# Patient Record
Sex: Female | Born: 1965 | Race: White | Hispanic: No | Marital: Single | Smoking: Current every day smoker
Health system: Southern US, Community
[De-identification: ages and names within clinical notes are randomized; demographics above are authoritative.]

## PROBLEM LIST (undated history)

## (undated) DIAGNOSIS — F319 Bipolar disorder, unspecified: Secondary | ICD-10-CM

## (undated) DIAGNOSIS — I10 Essential (primary) hypertension: Secondary | ICD-10-CM

## (undated) DIAGNOSIS — I671 Cerebral aneurysm, nonruptured: Secondary | ICD-10-CM

## (undated) HISTORY — PX: CHOLECYSTECTOMY: SHX55

## (undated) HISTORY — PX: OTHER SURGICAL HISTORY: SHX169

---

## 2002-02-06 ENCOUNTER — Ambulatory Visit (HOSPITAL_COMMUNITY): Admission: RE | Admit: 2002-02-06 | Discharge: 2002-02-06 | Payer: Self-pay | Admitting: *Deleted

## 2002-02-06 ENCOUNTER — Encounter: Payer: Self-pay | Admitting: *Deleted

## 2002-02-27 ENCOUNTER — Encounter: Payer: Self-pay | Admitting: Family Medicine

## 2002-02-27 ENCOUNTER — Ambulatory Visit (HOSPITAL_COMMUNITY): Admission: RE | Admit: 2002-02-27 | Discharge: 2002-02-27 | Payer: Self-pay | Admitting: Family Medicine

## 2002-08-26 ENCOUNTER — Ambulatory Visit (HOSPITAL_COMMUNITY): Admission: RE | Admit: 2002-08-26 | Discharge: 2002-08-26 | Payer: Self-pay | Admitting: *Deleted

## 2002-08-26 ENCOUNTER — Encounter: Payer: Self-pay | Admitting: *Deleted

## 2002-09-11 ENCOUNTER — Encounter (HOSPITAL_COMMUNITY): Admission: RE | Admit: 2002-09-11 | Discharge: 2002-10-11 | Payer: Self-pay | Admitting: Internal Medicine

## 2002-09-11 ENCOUNTER — Encounter (INDEPENDENT_AMBULATORY_CARE_PROVIDER_SITE_OTHER): Payer: Self-pay | Admitting: Internal Medicine

## 2002-09-24 ENCOUNTER — Encounter (INDEPENDENT_AMBULATORY_CARE_PROVIDER_SITE_OTHER): Payer: Self-pay | Admitting: Internal Medicine

## 2002-09-24 ENCOUNTER — Ambulatory Visit (HOSPITAL_COMMUNITY): Admission: RE | Admit: 2002-09-24 | Discharge: 2002-09-24 | Payer: Self-pay | Admitting: Internal Medicine

## 2003-04-20 ENCOUNTER — Emergency Department (HOSPITAL_COMMUNITY): Admission: EM | Admit: 2003-04-20 | Discharge: 2003-04-20 | Payer: Self-pay | Admitting: Emergency Medicine

## 2003-04-21 ENCOUNTER — Inpatient Hospital Stay (HOSPITAL_COMMUNITY): Admission: AD | Admit: 2003-04-21 | Discharge: 2003-04-23 | Payer: Self-pay | Admitting: General Surgery

## 2007-08-28 ENCOUNTER — Ambulatory Visit: Payer: Self-pay | Admitting: Cardiology

## 2013-08-27 ENCOUNTER — Inpatient Hospital Stay: Payer: Self-pay | Admitting: Psychiatry

## 2013-08-27 LAB — BEHAVIORAL MEDICINE 1 PANEL
Albumin: 3.6 g/dL (ref 3.4–5.0)
Anion Gap: 7 (ref 7–16)
BUN: 10 mg/dL (ref 7–18)
Bilirubin,Total: 0.2 mg/dL (ref 0.2–1.0)
Calcium, Total: 8.8 mg/dL (ref 8.5–10.1)
Chloride: 108 mmol/L — ABNORMAL HIGH (ref 98–107)
Creatinine: 1.31 mg/dL — ABNORMAL HIGH (ref 0.60–1.30)
EGFR (African American): 56 — ABNORMAL LOW
EGFR (Non-African Amer.): 48 — ABNORMAL LOW
Eosinophil #: 0.3 10*3/uL (ref 0.0–0.7)
Eosinophil %: 3.3 %
Glucose: 106 mg/dL — ABNORMAL HIGH (ref 65–99)
HCT: 37.5 % (ref 35.0–47.0)
Lymphocyte #: 2.8 10*3/uL (ref 1.0–3.6)
Lymphocyte %: 33.1 %
MCH: 31.6 pg (ref 26.0–34.0)
MCHC: 35 g/dL (ref 32.0–36.0)
Monocyte #: 0.6 x10 3/mm (ref 0.2–0.9)
Neutrophil #: 4.7 10*3/uL (ref 1.4–6.5)
Neutrophil %: 55.4 %
Osmolality: 277 (ref 275–301)
Platelet: 346 10*3/uL (ref 150–440)
Potassium: 3.7 mmol/L (ref 3.5–5.1)
RBC: 4.16 10*6/uL (ref 3.80–5.20)
RDW: 14.1 % (ref 11.5–14.5)
SGOT(AST): 34 U/L (ref 15–37)
Sodium: 139 mmol/L (ref 136–145)
Total Protein: 7.1 g/dL (ref 6.4–8.2)
WBC: 8.5 10*3/uL (ref 3.6–11.0)

## 2013-08-27 LAB — URINALYSIS, COMPLETE
Bacteria: NONE SEEN
Glucose,UR: NEGATIVE mg/dL (ref 0–75)
Ketone: NEGATIVE
Leukocyte Esterase: NEGATIVE
Nitrite: NEGATIVE
Ph: 5 (ref 4.5–8.0)
Protein: NEGATIVE
RBC,UR: <1 /HPF (ref 0–5)
Squamous Epithelial: 1

## 2013-08-29 ENCOUNTER — Ambulatory Visit: Payer: Self-pay | Admitting: Psychiatry

## 2013-09-29 ENCOUNTER — Ambulatory Visit: Payer: Self-pay | Admitting: Psychiatry

## 2013-10-28 ENCOUNTER — Ambulatory Visit: Payer: Self-pay | Admitting: Psychiatry

## 2013-11-05 ENCOUNTER — Emergency Department (HOSPITAL_COMMUNITY): Payer: Medicaid - Out of State

## 2013-11-05 ENCOUNTER — Emergency Department (HOSPITAL_COMMUNITY)
Admission: EM | Admit: 2013-11-05 | Discharge: 2013-11-05 | Disposition: A | Discharge status: Home / Self Care | Payer: Medicaid - Out of State | Attending: Emergency Medicine | Admitting: Emergency Medicine

## 2013-11-05 ENCOUNTER — Encounter (HOSPITAL_COMMUNITY): Payer: Self-pay | Admitting: Emergency Medicine

## 2013-11-05 DIAGNOSIS — F458 Other somatoform disorders: Secondary | ICD-10-CM | POA: Insufficient documentation

## 2013-11-05 DIAGNOSIS — R0602 Shortness of breath: Secondary | ICD-10-CM | POA: Insufficient documentation

## 2013-11-05 DIAGNOSIS — R290 Tetany: Secondary | ICD-10-CM | POA: Insufficient documentation

## 2013-11-05 DIAGNOSIS — F411 Generalized anxiety disorder: Secondary | ICD-10-CM | POA: Insufficient documentation

## 2013-11-05 DIAGNOSIS — R209 Unspecified disturbances of skin sensation: Secondary | ICD-10-CM | POA: Insufficient documentation

## 2013-11-05 DIAGNOSIS — Z880 Allergy status to penicillin: Secondary | ICD-10-CM | POA: Insufficient documentation

## 2013-11-05 DIAGNOSIS — I10 Essential (primary) hypertension: Secondary | ICD-10-CM | POA: Insufficient documentation

## 2013-11-05 HISTORY — DX: Bipolar disorder, unspecified: F31.9

## 2013-11-05 HISTORY — DX: Cerebral aneurysm, nonruptured: I67.1

## 2013-11-05 HISTORY — DX: Essential (primary) hypertension: I10

## 2013-11-05 LAB — CBC WITH DIFFERENTIAL/PLATELET
Basophils Absolute: 0 10*3/uL (ref 0.0–0.1)
HCT: 40.7 % (ref 36.0–46.0)
Hemoglobin: 14.2 g/dL (ref 12.0–15.0)
Lymphocytes Relative: 18 % (ref 12–46)
Lymphs Abs: 2.6 10*3/uL (ref 0.7–4.0)
MCH: 31.3 pg (ref 26.0–34.0)
Monocytes Absolute: 0.7 10*3/uL (ref 0.1–1.0)
Monocytes Relative: 5 % (ref 3–12)
Neutro Abs: 10.8 10*3/uL — ABNORMAL HIGH (ref 1.7–7.7)
Platelets: 361 10*3/uL (ref 150–400)
RBC: 4.54 MIL/uL (ref 3.87–5.11)
RDW: 13.2 % (ref 11.5–15.5)
WBC: 14.2 10*3/uL — ABNORMAL HIGH (ref 4.0–10.5)

## 2013-11-05 LAB — BASIC METABOLIC PANEL
CO2: 22 mEq/L (ref 19–32)
Chloride: 101 mEq/L (ref 96–112)
Creatinine, Ser: 1.05 mg/dL (ref 0.50–1.10)
GFR calc Af Amer: 72 mL/min — ABNORMAL LOW (ref >90)
Glucose, Bld: 99 mg/dL (ref 70–99)
Sodium: 136 mEq/L (ref 135–145)

## 2013-11-05 LAB — TROPONIN I: Troponin I: <0.3 ng/mL (ref <0.30)

## 2013-11-05 MED ORDER — ACETAMINOPHEN 500 MG PO TABS
1000.0000 mg | ORAL_TABLET | Freq: Once | ORAL | Status: AC
  Administered 2013-11-05: 1000 mg via ORAL
  Filled 2013-11-05: qty 2

## 2013-11-05 NOTE — ED Provider Notes (Addendum)
CSN: [id number]     Arrival date & time 11/05/13  1550 History   First MD Initiated Contact with Patient 11/05/13 1630     Chief Complaint  Patient presents with  . Dizziness   (Consider location/radiation/quality/duration/timing/severity/associated sxs/prior Treatment) HPI Comments: She presents to the ER for evaluation of numbness, tingling around the mouth and both hands and feet. Symptoms began suddenly around noon today. Symptoms persisted and then she had spasms and twisting of both wrists. She felt slightly short of breath when this occurred. There was no chest pain, or palpitations noted. Patient did notice pain on the anterior portion of her neck on the right side. She did not have any headache. All symptoms are improved now.   Past Medical History  Diagnosis Date  . Hypertension   . Cerebral aneurysm   . Bipolar 1 disorder    Past Surgical History  Procedure Laterality Date  . Cholecystectomy    . Prediabetic    . Ect treatments     No family history on file. History  Substance Use Topics  . Smoking status: Not on file  . Smokeless tobacco: Not on file  . Alcohol Use: Not on file   OB History   Grav Para Term Preterm Abortions TAB SAB Ect Mult Living                 Review of Systems  Respiratory: Positive for shortness of breath.   Neurological: Positive for dizziness and numbness. Negative for headaches.  All other systems reviewed and are negative.    Allergies  Ciprofloxacin; Penicillins; and Sulfa antibiotics  Home Medications  No current outpatient prescriptions on file. BP 132/91  Pulse 80  Temp(Src) 98.6 F (37 C) (Oral)  Resp 20  Ht 5\' 7"  (1.702 m)  Wt 222 lb (100.699 kg)  BMI 34.76 kg/m2  SpO2 100%  LMP 11/05/2012 Physical Exam  Constitutional: She is oriented to person, place, and time. She appears well-developed and well-nourished. No distress.  HENT:  Head: Normocephalic and atraumatic.  Right Ear: Hearing normal.  Left Ear: Hearing  normal.  Nose: Nose normal.  Mouth/Throat: Oropharynx is clear and moist and mucous membranes are normal.  Eyes: Conjunctivae and EOM are normal. Pupils are equal, round, and reactive to light.  Neck: Normal range of motion. Neck supple.  Cardiovascular: Regular rhythm, S1 normal and S2 normal.  Exam reveals no gallop and no friction rub.   No murmur heard. Pulmonary/Chest: Effort normal and breath sounds normal. No respiratory distress. She exhibits no tenderness.  Abdominal: Soft. Normal appearance and bowel sounds are normal. There is no hepatosplenomegaly. There is no tenderness. There is no rebound, no guarding, no tenderness at McBurney's point and negative Murphy's sign. No hernia.  Musculoskeletal: Normal range of motion.  Neurological: She is alert and oriented to person, place, and time. She has normal strength. No cranial nerve deficit or sensory deficit. Coordination normal. GCS eye subscore is 4. GCS verbal subscore is 5. GCS motor subscore is 6.  Skin: Skin is warm, dry and intact. No rash noted. No cyanosis.  Psychiatric: Her speech is normal and behavior is normal. Thought content normal. Her mood appears anxious.    ED Course  Procedures (including critical care time) Labs Review Labs Reviewed  CBC WITH DIFFERENTIAL - Abnormal; Notable for the following:    WBC 14.2 (*)    Neutro Abs 10.8 (*)    All other components within normal limits  BASIC METABOLIC PANEL - Abnormal;  Notable for the following:    GFR calc non Af Amer 62 (*)    GFR calc Af Amer 72 (*)    All other components within normal limits  TROPONIN I   Imaging Review Ct Head Wo Contrast  11/05/2013   CLINICAL DATA:  Dizziness, left neck pain, left arm numbness, pain down right arm, stumbling, past history of cerebral aneurysm, ECT  EXAM: CT HEAD WITHOUT CONTRAST  TECHNIQUE: Contiguous axial images were obtained from the base of the skull through the vertex without intravenous contrast.  COMPARISON:  None  available  FINDINGS: Normal ventricular morphology.  No midline shift or mass effect.  Normal appearance of brain parenchyma.  No intracranial hemorrhage, mass lesion, or definite evidence acute infarction.  Questionable low-attenuation focus versus artifact at the left pons.  No extra-axial fluid collections.  Streak artifacts at skull base including brainstem.  Bones and sinuses unremarkable.  IMPRESSION: Questionable low-attenuation focus at the left pons versus beam hardening artifact, unable to exclude a subtle left pontine infarct.  Otherwise negative exam.   Electronically Signed   By: Ulyses Southward M.D.   On: 11/05/2013 17:22    EKG Interpretation   None       Date: 11/05/2013  Rate: 61  Rhythm: normal sinus rhythm  QRS Axis: normal  Intervals: normal  ST/T Wave abnormalities: normal  Conduction Disutrbances: none  Narrative Interpretation: unremarkable     MDM  Diagnosis: Dizziness; Hyperventilation syndrome   Patient presents to the ER for evaluation symptoms are consistent with hyperventilation syndrome with carpopedal spasm. Patient had onset of numbness around the entire mouth with numbness and tingling of both hands and spasm of the wrists. There was no associated headache. Patient was concerned because she was diagnosed with a cerebral aneurysm in the past. This is apparently being watched. There are no focal findings on examination, neurologic exam is essentially normal. Nothing about history is unilateral or concerning for acute CVA. Patient did not have any chest pain associated with the symptoms. EKG was normal. Cardiac workup, lab work essentially unremarkable. CT scan did not show any evidence of acute bleeding. Questionable low-attenuation focus in the left pons, but there is not any corresponding neurologic findings or deficit to this. CT scan performed to evaluate for complication of aneurysm, no bleeding seen. Very little concern for acute CVA based on exam and  presentation.  Patient reassured, symptoms are improved without intervention. Continue current medication regimen. Patient has Valium prescribed 3 times a day as needed for anxiety, has not taken any in several days. She is encouraged to use it if she has any further anxiety symptoms. Followup with primary doctor. Return to the ER if symptoms worsen.    Gilda Crease, MD 11/05/13 1751  Gilda Crease, MD 11/05/13 [phone]

## 2013-11-05 NOTE — ED Notes (Signed)
Dizzy, pain lt side of neck and down rt arm. Says she has been stumbling . Had recent ECT treatments,,  And hx of cerebral aneurysm..   Numbness in lt arm

## 2013-11-15 LAB — CBC WITH DIFFERENTIAL/PLATELET
Basophil %: 0.5 %
Eosinophil #: 0.2 10*3/uL (ref 0.0–0.7)
Eosinophil %: 2 %
HGB: 12.5 g/dL (ref 12.0–16.0)
Lymphocyte #: 2.3 10*3/uL (ref 1.0–3.6)
Lymphocyte %: 26 %
Monocyte #: 0.5 x10 3/mm (ref 0.2–0.9)
Neutrophil #: 5.7 10*3/uL (ref 1.4–6.5)
Neutrophil %: 65.4 %
Platelet: 354 10*3/uL (ref 150–440)
RDW: 13.8 % (ref 11.5–14.5)
WBC: 8.7 10*3/uL (ref 3.6–11.0)

## 2013-11-28 ENCOUNTER — Ambulatory Visit: Payer: Self-pay | Admitting: Psychiatry

## 2013-12-30 ENCOUNTER — Ambulatory Visit: Payer: Self-pay | Admitting: Psychiatry

## 2014-01-08 ENCOUNTER — Emergency Department (HOSPITAL_COMMUNITY): Payer: Medicaid - Out of State

## 2014-01-08 ENCOUNTER — Emergency Department (HOSPITAL_COMMUNITY)
Admission: EM | Admit: 2014-01-08 | Discharge: 2014-01-08 | Discharge status: Against Medical Advice | Payer: Medicaid - Out of State | Attending: Emergency Medicine | Admitting: Emergency Medicine

## 2014-01-08 ENCOUNTER — Encounter (HOSPITAL_COMMUNITY): Payer: Self-pay | Admitting: Emergency Medicine

## 2014-01-08 DIAGNOSIS — Z880 Allergy status to penicillin: Secondary | ICD-10-CM | POA: Insufficient documentation

## 2014-01-08 DIAGNOSIS — Z7982 Long term (current) use of aspirin: Secondary | ICD-10-CM | POA: Insufficient documentation

## 2014-01-08 DIAGNOSIS — Z79899 Other long term (current) drug therapy: Secondary | ICD-10-CM | POA: Insufficient documentation

## 2014-01-08 DIAGNOSIS — Z791 Long term (current) use of non-steroidal anti-inflammatories (NSAID): Secondary | ICD-10-CM | POA: Insufficient documentation

## 2014-01-08 DIAGNOSIS — IMO0002 Reserved for concepts with insufficient information to code with codable children: Secondary | ICD-10-CM | POA: Insufficient documentation

## 2014-01-08 DIAGNOSIS — R079 Chest pain, unspecified: Secondary | ICD-10-CM

## 2014-01-08 DIAGNOSIS — R112 Nausea with vomiting, unspecified: Secondary | ICD-10-CM | POA: Insufficient documentation

## 2014-01-08 DIAGNOSIS — F319 Bipolar disorder, unspecified: Secondary | ICD-10-CM | POA: Insufficient documentation

## 2014-01-08 DIAGNOSIS — R071 Chest pain on breathing: Secondary | ICD-10-CM | POA: Insufficient documentation

## 2014-01-08 DIAGNOSIS — F17200 Nicotine dependence, unspecified, uncomplicated: Secondary | ICD-10-CM | POA: Insufficient documentation

## 2014-01-08 DIAGNOSIS — I10 Essential (primary) hypertension: Secondary | ICD-10-CM | POA: Insufficient documentation

## 2014-01-08 LAB — CBC WITH DIFFERENTIAL/PLATELET
BASOS PCT: 0 % (ref 0–1)
Basophils Absolute: 0 10*3/uL (ref 0.0–0.1)
EOS ABS: 0.3 10*3/uL (ref 0.0–0.7)
EOS PCT: 3 % (ref 0–5)
HCT: 36.3 % (ref 36.0–46.0)
Hemoglobin: 12.5 g/dL (ref 12.0–15.0)
LYMPHS ABS: 4 10*3/uL (ref 0.7–4.0)
Lymphocytes Relative: 36 % (ref 12–46)
MCH: 31.2 pg (ref 26.0–34.0)
MCHC: 34.4 g/dL (ref 30.0–36.0)
MCV: 90.5 fL (ref 78.0–100.0)
Monocytes Absolute: 0.7 10*3/uL (ref 0.1–1.0)
Monocytes Relative: 6 % (ref 3–12)
Neutro Abs: 6.1 10*3/uL (ref 1.7–7.7)
Neutrophils Relative %: 55 % (ref 43–77)
PLATELETS: 450 10*3/uL — AB (ref 150–400)
RBC: 4.01 MIL/uL (ref 3.87–5.11)
RDW: 13.6 % (ref 11.5–15.5)
WBC: 11.1 10*3/uL — ABNORMAL HIGH (ref 4.0–10.5)

## 2014-01-08 LAB — COMPREHENSIVE METABOLIC PANEL
ALBUMIN: 4.1 g/dL (ref 3.5–5.2)
ALK PHOS: 85 U/L (ref 39–117)
ALT: 25 U/L (ref 0–35)
AST: 11 U/L (ref 0–37)
BUN: 12 mg/dL (ref 6–23)
CALCIUM: 9.2 mg/dL (ref 8.4–10.5)
CO2: 23 mEq/L (ref 19–32)
Chloride: 102 mEq/L (ref 96–112)
Creatinine, Ser: 1.01 mg/dL (ref 0.50–1.10)
GFR calc non Af Amer: 65 mL/min — ABNORMAL LOW (ref >90)
GFR, EST AFRICAN AMERICAN: 76 mL/min — AB (ref >90)
GLUCOSE: 83 mg/dL (ref 70–99)
POTASSIUM: 3.9 meq/L (ref 3.7–5.3)
SODIUM: 141 meq/L (ref 137–147)
TOTAL PROTEIN: 8 g/dL (ref 6.0–8.3)
Total Bilirubin: 0.2 mg/dL — ABNORMAL LOW (ref 0.3–1.2)

## 2014-01-08 LAB — LIPASE, BLOOD: Lipase: 38 U/L (ref 11–59)

## 2014-01-08 LAB — TROPONIN I: Troponin I: <0.3 ng/mL (ref <0.30)

## 2014-01-08 MED ORDER — NITROGLYCERIN 0.4 MG SL SUBL
0.4000 mg | SUBLINGUAL_TABLET | Freq: Once | SUBLINGUAL | Status: AC
  Administered 2014-01-08: 0.4 mg via SUBLINGUAL

## 2014-01-08 MED ORDER — ONDANSETRON HCL 4 MG/2ML IJ SOLN
4.0000 mg | Freq: Once | INTRAMUSCULAR | Status: AC
  Administered 2014-01-08: 4 mg via INTRAVENOUS
  Filled 2014-01-08: qty 2

## 2014-01-08 NOTE — ED Notes (Signed)
Complain of pain in chest pain and right arm pain

## 2014-01-08 NOTE — ED Provider Notes (Signed)
CSN: [id number]     Arrival date & time 01/08/14  1720 History  This chart was scribed for [employer]. Rubin Payor, MD by Danella Maiers, ED Scribe. This patient was seen in room [facility]14[facility]14 and the patient's care was started at 5:43 PM.    Chief Complaint  Patient presents with  . Chest Pain   The history is provided by the patient. No language interpreter was used.   HPI Comments: Kristi Richardson is a 48 y.o. female with a h/o HTN who presents to the Emergency Department complaining of sudden-onset, sharp, left-sided CP with associated nausea and vomiting onset this morning when she stood up from the couch. She reports intermittent episodes of shooting pains lasting 30-45 minutes at a time since this morning. She is having mild pain currently but states it is improving. She took a baby aspirin this morning. She started on blood pressure medication yesterday. She has never had a stress test. She reports feeling fine the last few days. She has been undergoing ECT once per week, last treatment was 5 days ago. She denies any new activites or exercise. Her mom has CHF and both parents have had MIs.    Past Medical History  Diagnosis Date  . Hypertension   . Cerebral aneurysm   . Bipolar 1 disorder    Past Surgical History  Procedure Laterality Date  . Cholecystectomy    . Prediabetic    . Ect treatments     No family history on file. History  Substance Use Topics  . Smoking status: Current Some Day Smoker  . Smokeless tobacco: Not on file  . Alcohol Use: No   OB History   Grav Para Term Preterm Abortions TAB SAB Ect Mult Living                 Review of Systems  Cardiovascular: Positive for chest pain.  Gastrointestinal: Positive for nausea and vomiting.   A complete 10 system review of systems was obtained and all systems are negative except as noted in the HPI and PMH.     Allergies  Penicillins; Ciprofloxacin; Risperidone and related; and Sulfa antibiotics  Home Medications    Current Outpatient Rx  Name  Route  Sig  Dispense  Refill  . albuterol (PROVENTIL HFA) 108 (90 BASE) MCG/ACT inhaler   Inhalation   Inhale 1 puff into the lungs every 6 (six) hours as needed for wheezing or shortness of breath.         Marland Kitchen amLODipine (NORVASC) 5 MG tablet   Oral   Take 5 mg by mouth daily.         Marland Kitchen aspirin EC 81 MG tablet   Oral   Take 81 mg by mouth every morning.         Marland Kitchen atenolol (TENORMIN) 25 MG tablet   Oral   Take 25 mg by mouth every morning.         . celecoxib (CELEBREX) 200 MG capsule   Oral   Take 200 mg by mouth 2 (two) times daily.         . cloNIDine (CATAPRES) 0.1 MG tablet   Oral   Take 0.1 mg by mouth at bedtime.         Marland Kitchen dexlansoprazole (DEXILANT) 60 MG capsule   Oral   Take 60 mg by mouth every morning.         . diazepam (VALIUM) 10 MG tablet   Oral   Take 5 mg  by mouth 3 (three) times daily as needed for anxiety or sleep.         . DULoxetine (CYMBALTA) 30 MG capsule   Oral   Take 30 mg by mouth daily.         . fluticasone-salmeterol (ADVAIR HFA) 230-21 MCG/ACT inhaler   Inhalation   Inhale 2 puffs into the lungs daily.         Marland Kitchen gabapentin (NEURONTIN) 300 MG capsule   Oral   Take 300 mg by mouth 4 (four) times daily.         . hydrOXYzine (VISTARIL) 25 MG capsule   Oral   Take 25 mg by mouth 2 (two) times daily as needed for anxiety (and/or sleep).         Marland Kitchen levothyroxine (SYNTHROID, LEVOTHROID) 75 MCG tablet   Oral   Take 75 mcg by mouth every morning.         . metFORMIN (GLUCOPHAGE-XR) 500 MG 24 hr tablet   Oral   Take 500 mg by mouth every morning.         Marland Kitchen omega-3 acid ethyl esters (LOVAZA) 1 G capsule   Oral   Take 1 g by mouth 2 (two) times daily.         . ondansetron (ZOFRAN) 4 MG tablet   Oral   Take 4 mg by mouth 2 (two) times daily as needed for nausea or vomiting.         . ranitidine (ZANTAC) 150 MG tablet   Oral   Take 150 mg by mouth at bedtime.          . rosuvastatin (CRESTOR) 20 MG tablet   Oral   Take 20 mg by mouth at bedtime.          BP 141/97  Pulse 99  Temp(Src) 98 F (36.7 C)  Resp 20  Ht 5\' 6"  (1.676 m)  Wt 240 lb (108.863 kg)  BMI 38.76 kg/m2  SpO2 96%  LMP 11/05/2012 Physical Exam  Nursing note and vitals reviewed. Constitutional: She is oriented to person, place, and time. She appears well-developed and well-nourished. No distress.  Flat affect  HENT:  Head: Normocephalic and atraumatic.  Eyes: EOM are normal.  Neck: Neck supple. No tracheal deviation present.  Cardiovascular: Normal rate, regular rhythm and normal heart sounds.   Pulmonary/Chest: Effort normal and breath sounds normal. No respiratory distress.  Mild tenderness of the anterior chest  Abdominal: Soft. There is no tenderness.  Musculoskeletal: Normal range of motion. She exhibits no edema.  Neurological: She is alert and oriented to person, place, and time.  Skin: Skin is warm and dry.  Psychiatric: She has a normal mood and affect. Her behavior is normal.    ED Course  Procedures (including critical care time) Medications  ondansetron (ZOFRAN) injection 4 mg (4 mg Intravenous Given 01/08/14 1822)  nitroGLYCERIN (NITROSTAT) SL tablet 0.4 mg (0.4 mg Sublingual Given 01/08/14 1822)    COORDINATION OF CARE: 5:57 PM- Discussed treatment plan with pt. Pt agrees to plan.  Results for orders placed during the hospital encounter of 01/08/14  CBC WITH DIFFERENTIAL      Result Value Ref Range   WBC 11.1 (*) 4.0 - 10.5 K/uL   RBC 4.01  3.87 - 5.11 MIL/uL   Hemoglobin 12.5  12.0 - 15.0 g/dL   HCT 40.9  81.1 - 91.4 %   MCV 90.5  78.0 - 100.0 fL   MCH 31.2  26.0 - 34.0  pg   MCHC 34.4  30.0 - 36.0 g/dL   RDW 16.1  09.6 - 04.5 %   Platelets 450 (*) 150 - 400 K/uL   Neutrophils Relative % 55  43 - 77 %   Neutro Abs 6.1  1.7 - 7.7 K/uL   Lymphocytes Relative 36  12 - 46 %   Lymphs Abs 4.0  0.7 - 4.0 K/uL   Monocytes Relative 6  3 - 12 %    Monocytes Absolute 0.7  0.1 - 1.0 K/uL   Eosinophils Relative 3  0 - 5 %   Eosinophils Absolute 0.3  0.0 - 0.7 K/uL   Basophils Relative 0  0 - 1 %   Basophils Absolute 0.0  0.0 - 0.1 K/uL  COMPREHENSIVE METABOLIC PANEL      Result Value Ref Range   Sodium 141  137 - 147 mEq/L   Potassium 3.9  3.7 - 5.3 mEq/L   Chloride 102  96 - 112 mEq/L   CO2 23  19 - 32 mEq/L   Glucose, Bld 83  70 - 99 mg/dL   BUN 12  6 - 23 mg/dL   Creatinine, Ser 4.09  0.50 - 1.10 mg/dL   Calcium 9.2  8.4 - 81.1 mg/dL   Total Protein 8.0  6.0 - 8.3 g/dL   Albumin 4.1  3.5 - 5.2 g/dL   AST 11  0 - 37 U/L   ALT 25  0 - 35 U/L   Alkaline Phosphatase 85  39 - 117 U/L   Total Bilirubin 0.2 (*) 0.3 - 1.2 mg/dL   GFR calc non Af Amer 65 (*) >90 mL/min   GFR calc Af Amer 76 (*) >90 mL/min  LIPASE, BLOOD      Result Value Ref Range   Lipase 38  11 - 59 U/L  TROPONIN I      Result Value Ref Range   Troponin I <0.30  <0.30 ng/mL   Dg Chest 2 View  01/08/2014   CLINICAL DATA:  Shortness of breath and chest pain  EXAM: CHEST  2 VIEW  COMPARISON:  None.  FINDINGS: The heart size and mediastinal contours are within normal limits. There is no focal infiltrate, pulmonary edema, or pleural effusion. There is mild scar or atelectasis of the bilateral lung bases. The visualized skeletal structures are unremarkable.  IMPRESSION: No active cardiopulmonary disease. Mild scar or atelectasis of the bilateral lung bases.   Electronically Signed   By: Sherian Rein M.D.   On: 01/08/2014 19:13      EKG Interpretation    Date/Time:  Wednesday January 08 2014 17:42:27 EST Ventricular Rate:  98 PR Interval:  156 QRS Duration: 88 QT Interval:  374 QTC Calculation: 477 R Axis:   19 Text Interpretation:  Normal sinus rhythm Normal ECG When compared with ECG of 05-Nov-2013 17:24, Vent. rate has increased BY  37 BPM QT has lengthened Confirmed by Rubin Payor  MD, Janaysha Depaulo ([id number]) on 01/08/2014 6:14:08 PM            MDM   Final  diagnoses:  Chest pain    Patient with chest pain. EKG reassuring and enzymes are negative x1. Patient has had some pain with exertion. She is pain-free now. She's not willing to stay for further evaluation. She is leaving against my advice. She's informed of the risks of this. She states she's got primary care followup tomorrow.  I personally performed the services described in this documentation, which was scribed  in my presence. The recorded information has been reviewed and is accurate.    Juliet RudeNathan R. Rubin PayorPickering, MD 01/08/14 2023

## 2014-01-08 NOTE — Discharge Instructions (Signed)
Chest Pain (Nonspecific) °It is often hard to give a specific diagnosis for the cause of chest pain. There is always a chance that your pain could be related to something serious, such as a heart attack or a blood clot in the lungs. You need to follow up with your caregiver for further evaluation. °CAUSES  °· Heartburn. °· Pneumonia or bronchitis. °· Anxiety or stress. °· Inflammation around your heart (pericarditis) or lung (pleuritis or pleurisy). °· A blood clot in the lung. °· A collapsed lung (pneumothorax). It can develop suddenly on its own (spontaneous pneumothorax) or from injury (trauma) to the chest. °· Shingles infection (herpes zoster virus). °The chest wall is composed of bones, muscles, and cartilage. Any of these can be the source of the pain. °· The bones can be bruised by injury. °· The muscles or cartilage can be strained by coughing or overwork. °· The cartilage can be affected by inflammation and become sore (costochondritis). °DIAGNOSIS  °Lab tests or other studies, such as X-rays, electrocardiography, stress testing, or cardiac imaging, may be needed to find the cause of your pain.  °TREATMENT  °· Treatment depends on what may be causing your chest pain. Treatment may include: °· Acid blockers for heartburn. °· Anti-inflammatory medicine. °· Pain medicine for inflammatory conditions. °· Antibiotics if an infection is present. °· You may be advised to change lifestyle habits. This includes stopping smoking and avoiding alcohol, caffeine, and chocolate. °· You may be advised to keep your head raised (elevated) when sleeping. This reduces the chance of acid going backward from your stomach into your esophagus. °· Most of the time, nonspecific chest pain will improve within 2 to 3 days with rest and mild pain medicine. °HOME CARE INSTRUCTIONS  °· If antibiotics were prescribed, take your antibiotics as directed. Finish them even if you start to feel better. °· For the next few days, avoid physical  activities that bring on chest pain. Continue physical activities as directed. °· Do not smoke. °· Avoid drinking alcohol. °· Only take over-the-counter or prescription medicine for pain, discomfort, or fever as directed by your caregiver. °· Follow your caregiver's suggestions for further testing if your chest pain does not go away. °· Keep any follow-up appointments you made. If you do not go to an appointment, you could develop lasting (chronic) problems with pain. If there is any problem keeping an appointment, you must call to reschedule. °SEEK MEDICAL CARE IF:  °· You think you are having problems from the medicine you are taking. Read your medicine instructions carefully. °· Your chest pain does not go away, even after treatment. °· You develop a rash with blisters on your chest. °SEEK IMMEDIATE MEDICAL CARE IF:  °· You have increased chest pain or pain that spreads to your arm, neck, jaw, back, or abdomen. °· You develop shortness of breath, an increasing cough, or you are coughing up blood. °· You have severe back or abdominal pain, feel nauseous, or vomit. °· You develop severe weakness, fainting, or chills. °· You have a fever. °THIS IS AN EMERGENCY. Do not wait to see if the pain will go away. Get medical help at once. Call your local emergency services (911 in [place] Do not drive yourself to the hospital. °MAKE SURE YOU:  °· Understand these instructions. °· Will watch your condition. °· Will get help right away if you are not doing well or get worse. °Document Released: 08/24/2005 Document Revised: 02/06/2012 Document Reviewed: 06/19/2008 °[employer]® Patient Information ©2014 [employer]. ° °

## 2014-01-26 ENCOUNTER — Ambulatory Visit: Payer: Self-pay | Admitting: Psychiatry

## 2014-02-07 ENCOUNTER — Emergency Department (HOSPITAL_COMMUNITY): Payer: Medicaid - Out of State

## 2014-02-07 ENCOUNTER — Encounter (HOSPITAL_COMMUNITY): Payer: Self-pay | Admitting: Emergency Medicine

## 2014-02-07 ENCOUNTER — Emergency Department (HOSPITAL_COMMUNITY)
Admission: EM | Admit: 2014-02-07 | Discharge: 2014-02-07 | Disposition: A | Discharge status: Home / Self Care | Payer: Medicaid - Out of State | Attending: Emergency Medicine | Admitting: Emergency Medicine

## 2014-02-07 DIAGNOSIS — R05 Cough: Secondary | ICD-10-CM

## 2014-02-07 DIAGNOSIS — J3489 Other specified disorders of nose and nasal sinuses: Secondary | ICD-10-CM | POA: Insufficient documentation

## 2014-02-07 DIAGNOSIS — F319 Bipolar disorder, unspecified: Secondary | ICD-10-CM | POA: Insufficient documentation

## 2014-02-07 DIAGNOSIS — R059 Cough, unspecified: Secondary | ICD-10-CM | POA: Insufficient documentation

## 2014-02-07 DIAGNOSIS — J029 Acute pharyngitis, unspecified: Secondary | ICD-10-CM | POA: Insufficient documentation

## 2014-02-07 DIAGNOSIS — R062 Wheezing: Secondary | ICD-10-CM | POA: Insufficient documentation

## 2014-02-07 DIAGNOSIS — F17200 Nicotine dependence, unspecified, uncomplicated: Secondary | ICD-10-CM | POA: Insufficient documentation

## 2014-02-07 DIAGNOSIS — I10 Essential (primary) hypertension: Secondary | ICD-10-CM | POA: Insufficient documentation

## 2014-02-07 DIAGNOSIS — Z791 Long term (current) use of non-steroidal anti-inflammatories (NSAID): Secondary | ICD-10-CM | POA: Insufficient documentation

## 2014-02-07 DIAGNOSIS — Z880 Allergy status to penicillin: Secondary | ICD-10-CM | POA: Insufficient documentation

## 2014-02-07 DIAGNOSIS — Z79899 Other long term (current) drug therapy: Secondary | ICD-10-CM | POA: Insufficient documentation

## 2014-02-07 DIAGNOSIS — Z7982 Long term (current) use of aspirin: Secondary | ICD-10-CM | POA: Insufficient documentation

## 2014-02-07 DIAGNOSIS — IMO0002 Reserved for concepts with insufficient information to code with codable children: Secondary | ICD-10-CM | POA: Insufficient documentation

## 2014-02-07 MED ORDER — BENZONATATE 100 MG PO CAPS
200.0000 mg | ORAL_CAPSULE | Freq: Once | ORAL | Status: AC
  Administered 2014-02-07: 200 mg via ORAL
  Filled 2014-02-07: qty 2

## 2014-02-07 MED ORDER — IPRATROPIUM-ALBUTEROL 0.5-2.5 (3) MG/3ML IN SOLN
3.0000 mL | Freq: Once | RESPIRATORY_TRACT | Status: AC
  Administered 2014-02-07: 3 mL via RESPIRATORY_TRACT
  Filled 2014-02-07: qty 3

## 2014-02-07 MED ORDER — BENZONATATE 100 MG PO CAPS: 200.0000 mg | ORAL_CAPSULE | Freq: Three times a day (TID) | ORAL | Status: AC | PRN

## 2014-02-07 MED ORDER — ALBUTEROL SULFATE (2.5 MG/3ML) 0.083% IN NEBU
2.5000 mg | INHALATION_SOLUTION | Freq: Once | RESPIRATORY_TRACT | Status: AC
Start: 2014-02-07 — End: 2014-02-07
  Administered 2014-02-07: 2.5 mg via RESPIRATORY_TRACT
  Filled 2014-02-07: qty 3

## 2014-02-07 MED ORDER — AZITHROMYCIN 250 MG PO TABS: ORAL_TABLET | ORAL | Status: AC

## 2014-02-07 NOTE — Discharge Instructions (Signed)

## 2014-02-07 NOTE — ED Notes (Signed)
Cough for 4 weeks, green phlegm.   Pt says she has lost 22 lbs.  Due to cough. Skin itches.

## 2014-02-07 NOTE — ED Provider Notes (Signed)
CSN: [id number]     Arrival date & time 02/07/14  1127 History   First MD Initiated Contact with Patient 02/07/14 1300     Chief Complaint  Patient presents with  . Cough     (Consider location/radiation/quality/duration/timing/severity/associated sxs/prior Treatment) Patient is a 48 y.o. female presenting with cough. The history is provided by the patient.  Cough Cough characteristics:  Productive Sputum characteristics:  Yellow and green Severity:  Moderate Duration:  4 weeks Timing:  Constant Progression:  Unchanged Chronicity:  New Smoker: yes   Context: upper respiratory infection   Relieved by:  Nothing Worsened by:  Activity and smoking Ineffective treatments:  Cough suppressants Associated symptoms: sore throat and wheezing   Associated symptoms: no chest pain, no chills, no ear pain, no fever, no headaches, no myalgias, no rash, no rhinorrhea, no shortness of breath and no sinus congestion   Sore throat:    Severity:  Mild   Onset quality:  Gradual   Timing:  Constant   Progression:  Unchanged Wheezing:    Severity:  Moderate   Onset quality:  Gradual   Duration:  4 weeks   Progression:  Unchanged   Chronicity:  New Risk factors: no recent infection and no recent travel     Past Medical History  Diagnosis Date  . Hypertension   . Cerebral aneurysm   . Bipolar 1 disorder    Past Surgical History  Procedure Laterality Date  . Cholecystectomy    . Prediabetic    . Ect treatments     History reviewed. No pertinent family history. History  Substance Use Topics  . Smoking status: Current Every Day Smoker    Types: Cigarettes  . Smokeless tobacco: Not on file  . Alcohol Use: No   OB History   Grav Para Term Preterm Abortions TAB SAB Ect Mult Living                 Review of Systems  Constitutional: Negative for fever, chills, activity change and appetite change.  HENT: Positive for congestion and sore throat. Negative for ear pain, facial swelling,  rhinorrhea and trouble swallowing.   Eyes: Negative for visual disturbance.  Respiratory: Positive for cough and wheezing. Negative for chest tightness, shortness of breath and stridor.   Cardiovascular: Negative for chest pain.  Gastrointestinal: Negative for nausea, vomiting and abdominal pain.  Musculoskeletal: Negative for myalgias, neck pain and neck stiffness.  Skin: Negative.  Negative for rash.  Neurological: Negative for dizziness, weakness, numbness and headaches.  Hematological: Negative for adenopathy.  Psychiatric/Behavioral: Negative for confusion.  All other systems reviewed and are negative.      Allergies  Penicillins; Ciprofloxacin; Norvasc; Risperidone and related; and Sulfa antibiotics  Home Medications   Current Outpatient Rx  Name  Route  Sig  Dispense  Refill  . albuterol (PROVENTIL HFA) 108 (90 BASE) MCG/ACT inhaler   Inhalation   Inhale 1 puff into the lungs every 6 (six) hours as needed for wheezing or shortness of breath.         Marland Kitchen. amLODipine (NORVASC) 5 MG tablet   Oral   Take 5 mg by mouth daily.         Marland Kitchen. aspirin EC 81 MG tablet   Oral   Take 81 mg by mouth every morning.         Marland Kitchen. atenolol (TENORMIN) 25 MG tablet   Oral   Take 50 mg by mouth daily.          .Marland Kitchen  butalbital-acetaminophen-caffeine (FIORICET, ESGIC) 50-325-40 MG per tablet   Oral   Take 1 tablet by mouth every 6 (six) hours as needed for headache or migraine.         . celecoxib (CELEBREX) 200 MG capsule   Oral   Take 200 mg by mouth 2 (two) times daily.         . cloNIDine (CATAPRES) 0.1 MG tablet   Oral   Take 0.1 mg by mouth at bedtime.         Marland Kitchen dexlansoprazole (DEXILANT) 60 MG capsule   Oral   Take 60 mg by mouth every morning.         . diazepam (VALIUM) 10 MG tablet   Oral   Take 5 mg by mouth 3 (three) times daily as needed for anxiety or sleep.         . DULoxetine (CYMBALTA) 30 MG capsule   Oral   Take 30 mg by mouth daily.         .  fluticasone-salmeterol (ADVAIR HFA) 230-21 MCG/ACT inhaler   Inhalation   Inhale 2 puffs into the lungs daily.         Marland Kitchen gabapentin (NEURONTIN) 300 MG capsule   Oral   Take 300 mg by mouth 4 (four) times daily.         . hydrOXYzine (VISTARIL) 25 MG capsule   Oral   Take 25 mg by mouth 2 (two) times daily as needed for anxiety (and/or sleep).         Marland Kitchen levothyroxine (SYNTHROID, LEVOTHROID) 75 MCG tablet   Oral   Take 75 mcg by mouth every morning.         . metFORMIN (GLUCOPHAGE-XR) 500 MG 24 hr tablet   Oral   Take 500 mg by mouth every morning.         Marland Kitchen omega-3 acid ethyl esters (LOVAZA) 1 G capsule   Oral   Take 1 g by mouth 2 (two) times daily.         . ondansetron (ZOFRAN) 4 MG tablet   Oral   Take 4 mg by mouth every 8 (eight) hours as needed for nausea or vomiting.          . ranitidine (ZANTAC) 150 MG tablet   Oral   Take 150 mg by mouth at bedtime.         . rosuvastatin (CRESTOR) 20 MG tablet   Oral   Take 20 mg by mouth at bedtime.         Marland Kitchen azithromycin (ZITHROMAX Z-PAK) 250 MG tablet      Take two tablets on day one, then one tab qd days 2-5   6 tablet   0   . benzonatate (TESSALON) 100 MG capsule   Oral   Take 2 capsules (200 mg total) by mouth 3 (three) times daily as needed for cough. Swallow whole, do not chew   21 capsule   0    BP 117/94  Pulse 83  Temp(Src) 98.5 F (36.9 C) (Oral)  Resp 20  Ht 5\' 6"  (1.676 m)  Wt 225 lb (102.059 kg)  BMI 36.33 kg/m2  SpO2 97%  LMP 01/07/2014 Physical Exam  Nursing note and vitals reviewed. Constitutional: She is oriented to person, place, and time. She appears well-developed and well-nourished. No distress.  HENT:  Head: Normocephalic and atraumatic.  Right Ear: Tympanic membrane and ear canal normal.  Left Ear: Tympanic membrane and ear canal normal.  Mouth/Throat:  Uvula is midline and mucous membranes are normal. Posterior oropharyngeal erythema present. No oropharyngeal  exudate, posterior oropharyngeal edema or tonsillar abscesses.  Eyes: EOM are normal. Pupils are equal, round, and reactive to light.  Neck: Normal range of motion, full passive range of motion without pain and phonation normal. Neck supple.  Cardiovascular: Normal rate, regular rhythm, normal heart sounds and intact distal pulses.   No murmur heard. Pulmonary/Chest: Effort normal. No stridor. No respiratory distress. She has wheezes. She has no rales. She exhibits no tenderness.  Coarse lungs sounds bilaterally.  Scattered expiratory wheezes.  No rales  Abdominal: Soft. She exhibits no distension. There is no tenderness. There is no rebound and no guarding.  Musculoskeletal: Normal range of motion. She exhibits no edema.  Lymphadenopathy:    She has no cervical adenopathy.  Neurological: She is alert and oriented to person, place, and time. She exhibits normal muscle tone. Coordination normal.  Skin: Skin is warm and dry.    ED Course  Procedures (including critical care time) Labs Review Labs Reviewed - No data to display Imaging Review Dg Chest 2 View  02/07/2014   CLINICAL DATA:  Cough, congestion  EXAM: CHEST  2 VIEW  COMPARISON:  January 08, 2014  FINDINGS: The heart size and mediastinal contours are within normal limits. There is no focal infiltrate, pulmonary edema, or pleural effusion. The visualized skeletal structures are unremarkable.  IMPRESSION: No active cardiopulmonary disease.   Electronically Signed   By: Sherian Rein M.D.   On: 02/07/2014 13:05     EKG Interpretation None      MDM   Final diagnoses:  Cough    Pt has own albuterol inhaler.  Lung sounds improved after neb. Cough improved after tessalon.  Pt denies chest pain or shortness of breath.  Clinical suspicion for PE is low.  Patient is well appearing , VSS.  Pt agrees to fluids. Albuterol, z pack and tessalon. Given referral info for PCP.  Also advised to return here if sx's are worsening.  Pt appears  stable for d/c  The patient appears reasonably screened and/or stabilized for discharge and I doubt any other medical condition or other [facility] requiring further screening, evaluation, or treatment in the ED at this time prior to discharge.    Uel Davidow L. Trisha Mangle, PA-C 02/08/14 1735

## 2014-02-10 NOTE — ED Provider Notes (Signed)
Medical screening examination/treatment/procedure(s) were performed by non-physician practitioner and as supervising physician I was immediately available for consultation/collaboration.   EKG Interpretation None        Laray AngerKathleen M Dahl Higinbotham, DO 02/10/14 1824

## 2014-02-26 ENCOUNTER — Ambulatory Visit: Payer: Self-pay | Admitting: Psychiatry
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
## 2015-03-20 NOTE — Discharge Summary (Signed)
PATIENT NAME:  Kristi Richardson, Kristi Richardson MR#:  [id number] DATE OF BIRTH:  [birth year]  DATE OF ADMISSION:  08/27/2013 DATE OF DISCHARGE: 08/29/2013   HOSPITAL COURSE: See dictated history and physical for details of admission. This 49 year old woman with a history of depression, that may be part of a bipolar disorder, was admitted to the hospital with the plan to initiate ECT treatment. The patient was kept on her usual outpatient medicines as much as possible, although we minimized her benzodiazepines with her cooperation. The usual lab tests as well as chest x-ray and EKG were performed and were all judged to be adequately safe. Anesthesia saw the patient prior to treatment. She had her first ECT treatment on 10/01 with a right unilateral treatment. The treatment itself went well. After the treatment, the patient had an episode of sustained high blood pressure, which was treated in the treatment room. In the recovery room and on the unit in the afternoon after treatment, she had symptoms of a diffuse pruritic rash over her trunk, arms and face. She did not complain of any shortness of breath. She was administered several doses of oral Vistaril and Benadryl with positive relief. The day after treatment, she is reporting that her mood feels a little bit better. She denies any suicidal ideation. She is not aware of any problems with memory or confusion. The patient has been counseled about ECTs risks and benefits. I have told her that I am going to speak with anesthesia today and we will try to make future treatments as safe it is possible by adjusting medication doses. She agrees to continue ECT. She will be discharged today on her usual home medications. She will return tomorrow morning for her second ECT treatment. She has followup medical treatment arranged in [place] at her usual psychiatric provider.   MENTAL STATUS EXAM AT DISCHARGE: Neatly dressed and groomed woman, looks her stated age. Good eye contact. Normal  psychomotor activity. Speech normal in rate, tone and volume. Affect is smiling and more upbeat than previously. Mood is stated as being a little bit better. Thoughts are lucid without any sign of loosening of associations or delusions. Denies auditory or visual hallucinations. Denies any suicidal or homicidal ideation. Indicates that she feels positive about the treatment and positive about going home. She has multiple positive things in her life she is looking forward to. She appears to be alert and oriented x 4. Normal intelligence. Good judgment and insight.   DISCHARGE MEDICATIONS: Albuterol inhaler 2 puffs as needed 4 times a day for shortness of breath, Vistaril 25 mg every 4 hours as needed for anxiety, Latuda 80 mg in the evening with supper, levothyroxine 75 mcg per day, Dexilant 60 mg once a day, Lovaza 2000 mg twice a day, Advair Diskus 1 puff inhaled 2 times a day or Qvar as an alternative, albuterol 25 mg once a day for high blood pressure, Valium 0.5 mg 3 times a day only as needed for anxiety, aspirin 81 mg per day, Cogentin 1 mg at night, Crestor 20 mg at night, Glucophage 850 mg in the morning, clonidine 0.15 mg at night and Celebrex 200 mg twice a day.   LABORATORY RESULTS: Lab tests done on 09/30 included a chemistry panel, which showed a slightly elevated creatinine at 1.31. Chloride elevated at 108. ALT elevated at 102. CBC entirely normal. Urinalysis normal. EKG normal sinus rhythm. No abnormalities. Chest x-ray showed some degree of minimal atelectasis in the left lung base. Blood sugars were checked  and were in the low 100s.   DISPOSITION: Discharge home. Return for ECT tomorrow and presumably into next week while continuing outpatient medical treatment at her usual provider.   DIAGNOSIS, PRINCIPAL AND PRIMARY:  AXIS I: Bipolar disorder, type II with current severe depression symptoms.  AXIS II: No diagnosis.  AXIS III: Pre-diabetes, hypertension, gastric reflux symptoms,  hypothyroidism and dyslipidemia.  AXIS IV: Moderate to severe from her chronic burden of illness and loss of functioning.  AXIS V: Functioning at time of discharge, 55.   ____________________________ Audery Amel, MD jtc:aw Richardson: 08/29/2013 10:45:29 ET T: 08/29/2013 10:56:15 ET JOB#: [id number]  cc: Audery Amel, MD, <Dictator> Audery Amel MD ELECTRONICALLY SIGNED 08/29/2013 16:38

## 2015-07-25 IMAGING — CR DG CHEST 2V
1 series · 2 of 2 positions shown · non-contrast
Comparison: none

REASON FOR EXAM: ect start tomorrow
COMMENTS:

[Series 1: w chest pa · 0.14mm/px · 2 of 2 slices shown]
[im 1/2]
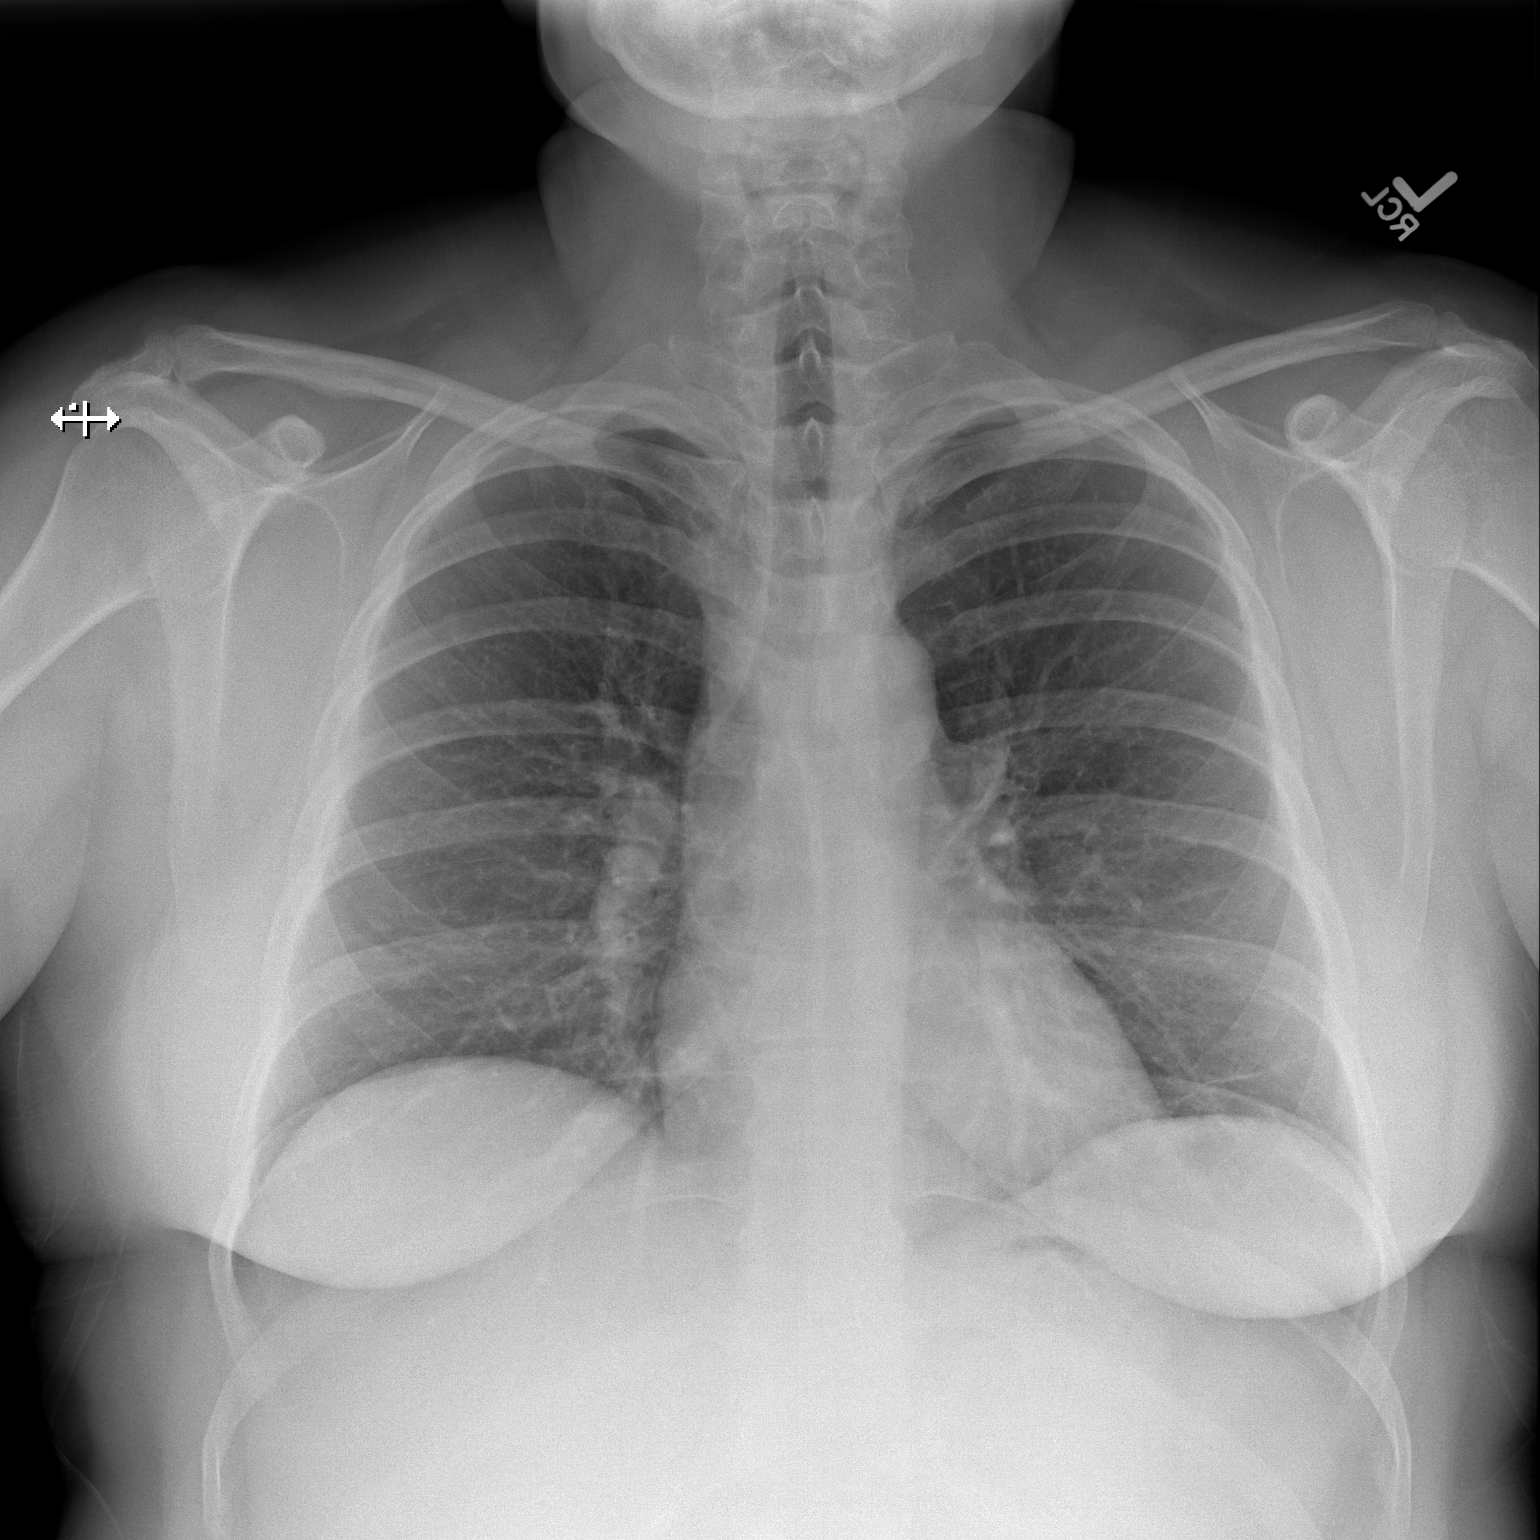
[im 2/2]
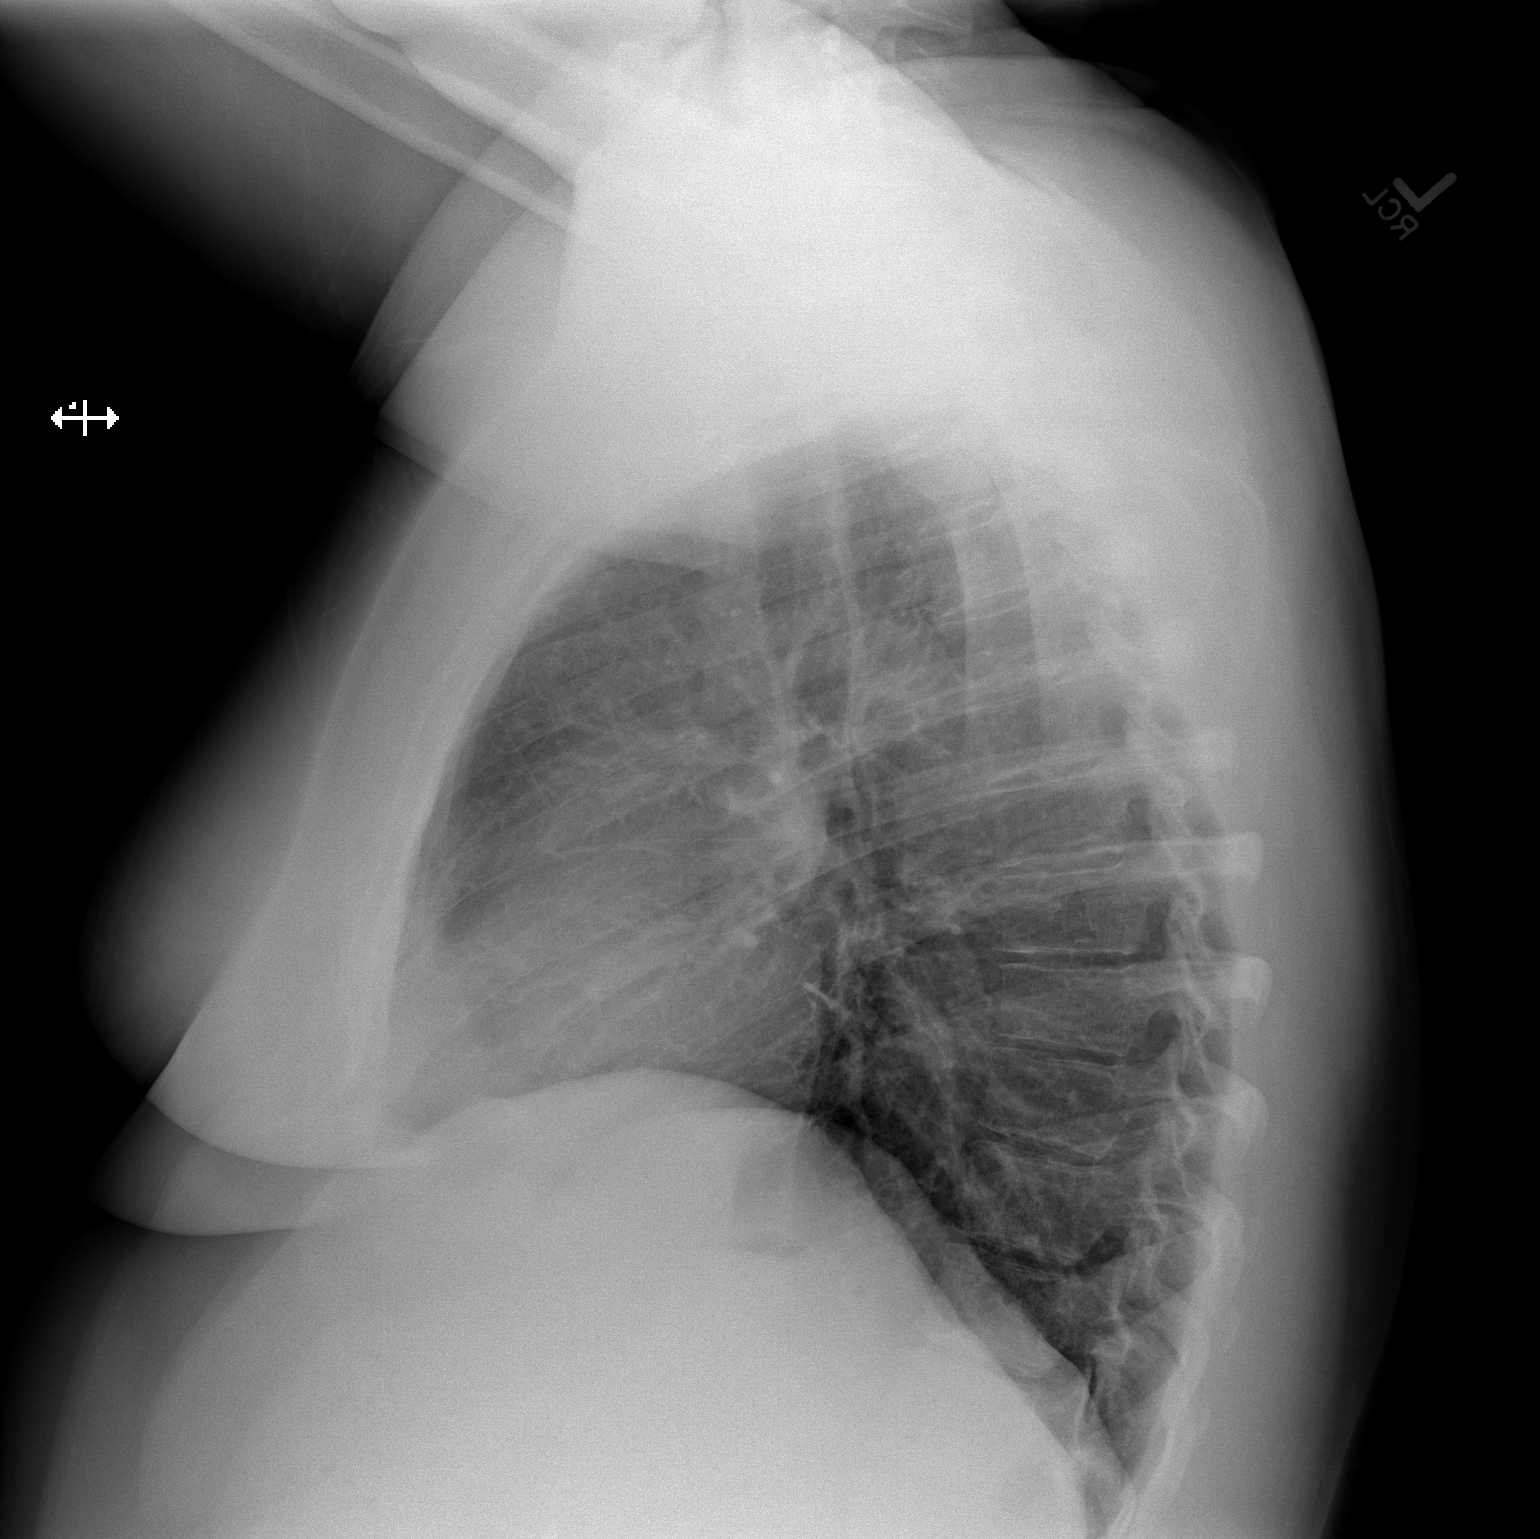

[2 of 2 positions shown; findings below may reference images not displayed]

PROCEDURE:     DXR - DXR CHEST PA (OR AP) AND LATERAL  - August 27, 2013  [DATE]

RESULT:     The lungs are adequately inflated. Minimal subsegmental
atelectasis versus scarring at the left lung base is suspected. There is no
pleural effusion or pneumothorax or alveolar pneumonia. The cardiac
silhouette is normal in size. The pulmonary vascularity is not engorged. The
observed portions of the bony thorax appear normal.
IMPRESSION: There is no definite evidence of acute cardiopulmonary
abnormality. There may be minimal atelectasis versus scarring at the left
lung base.

[REDACTED]

## 2015-12-06 IMAGING — CR DG CHEST 2V
2 series · 2 of 2 positions shown · non-contrast
Comparison: None.

CLINICAL DATA: Shortness of breath and chest pain

EXAM:
CHEST  2 VIEW

[view not recorded (1 of 2)]
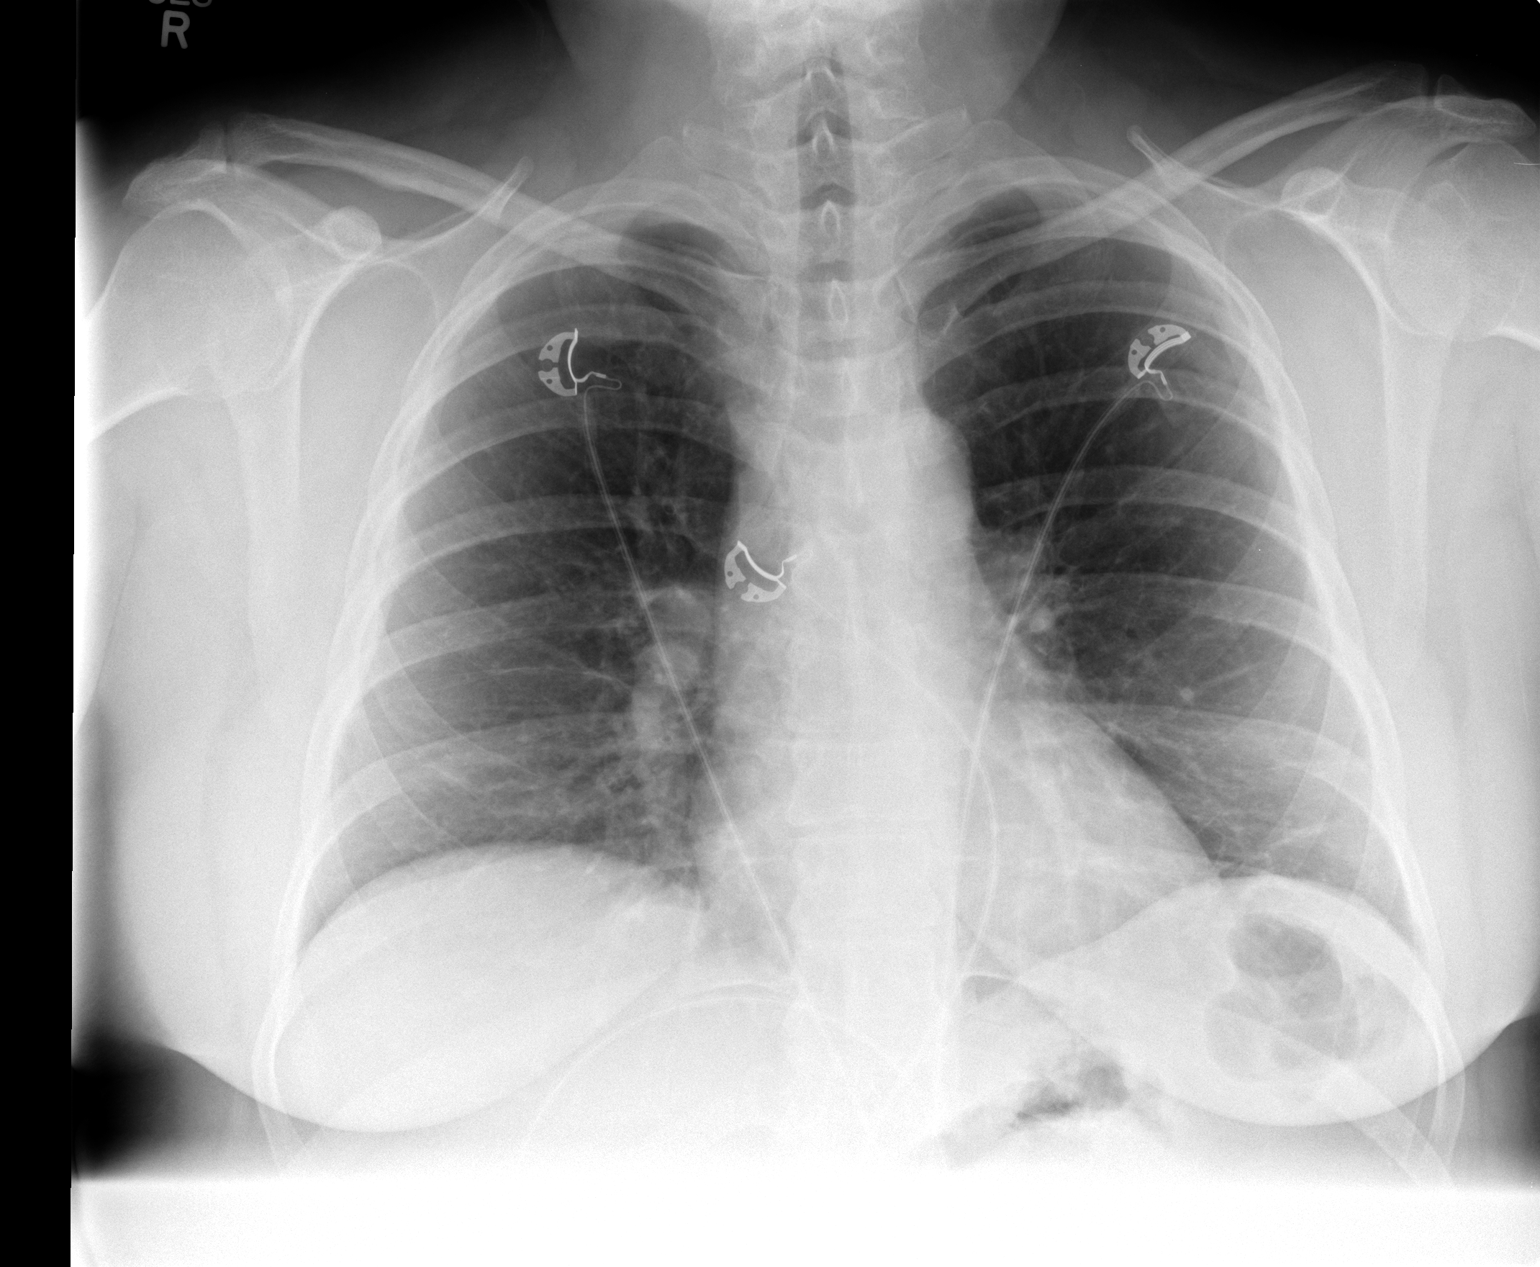

[view not recorded (2 of 2)]
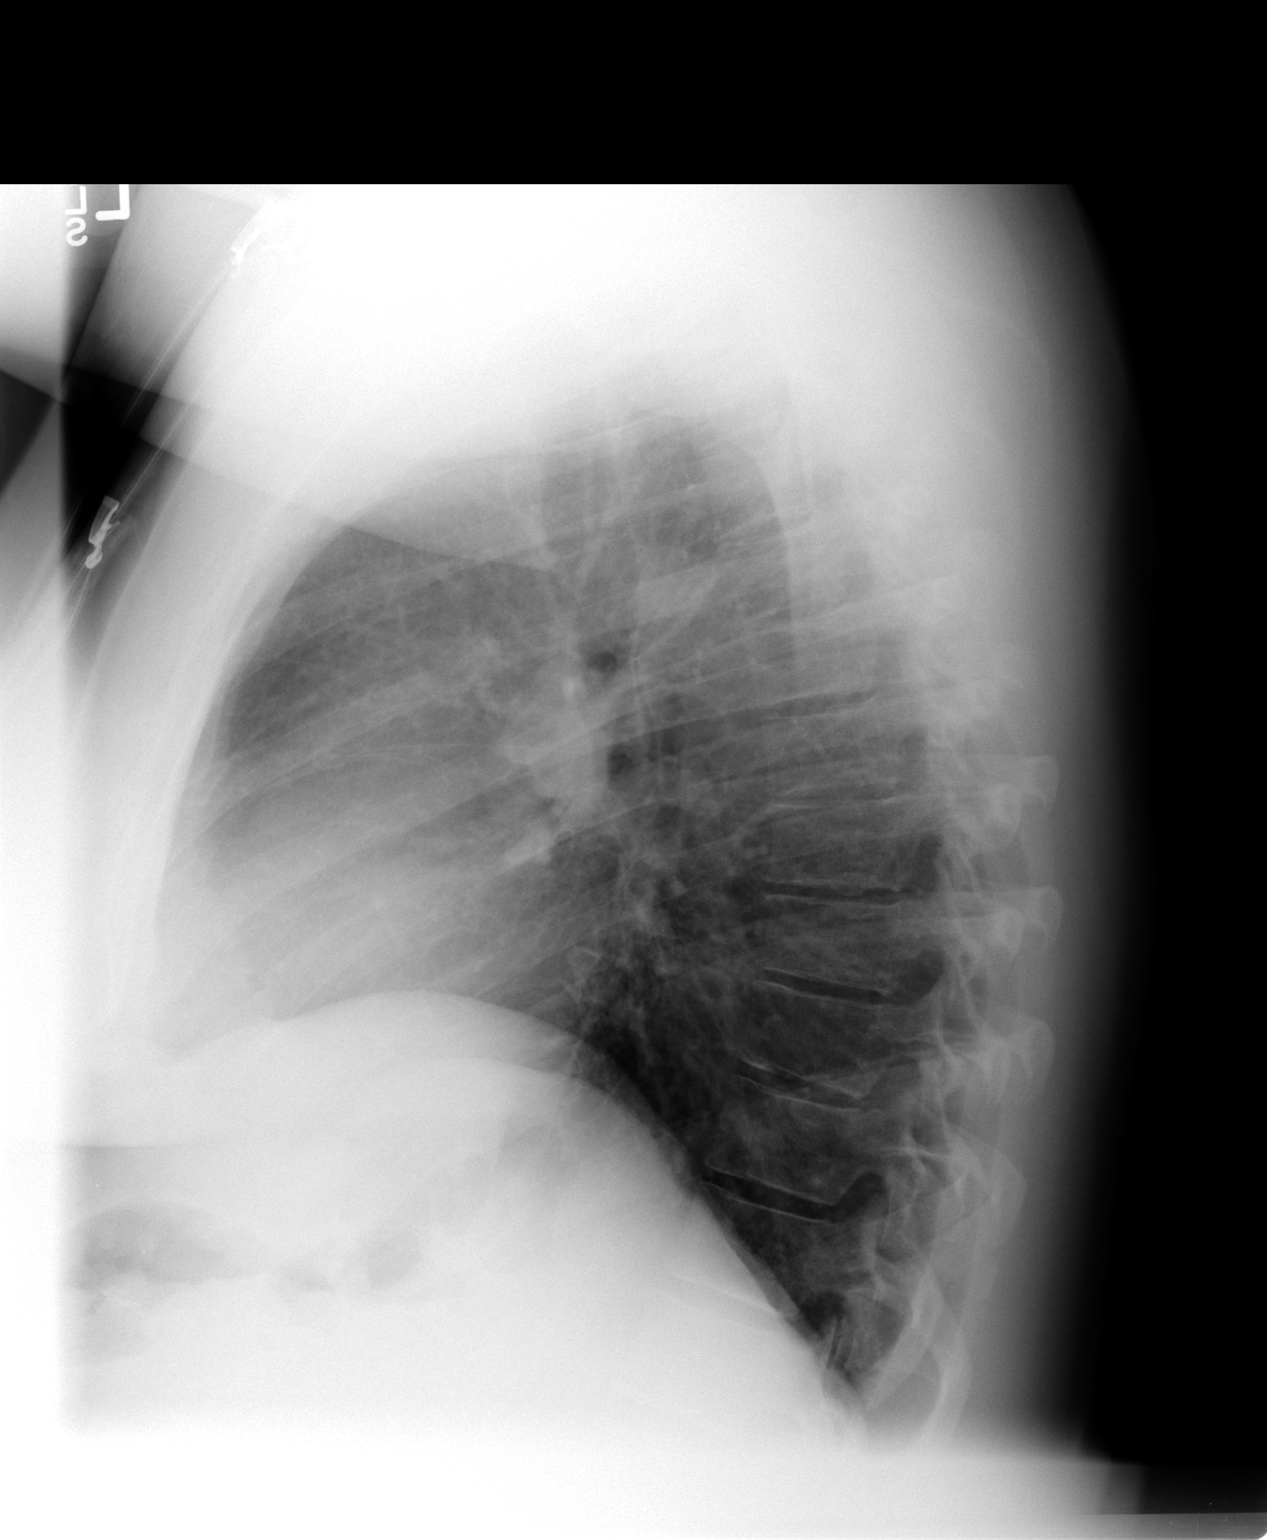

[2 of 2 positions shown; findings below may reference images not displayed]

FINDINGS: The heart size and mediastinal contours are within normal limits.
There is no focal infiltrate, pulmonary edema, or pleural effusion.
There is mild scar or atelectasis of the bilateral lung bases. The
visualized skeletal structures are unremarkable.
IMPRESSION: No active cardiopulmonary disease. Mild scar or atelectasis of the
bilateral lung bases.

## 2016-01-05 IMAGING — CR DG CHEST 2V
2 series · 2 of 2 positions shown · non-contrast
Comparison: January 08, 2014

CLINICAL DATA: Cough, congestion

EXAM:
CHEST  2 VIEW

[view not recorded (1 of 2)]
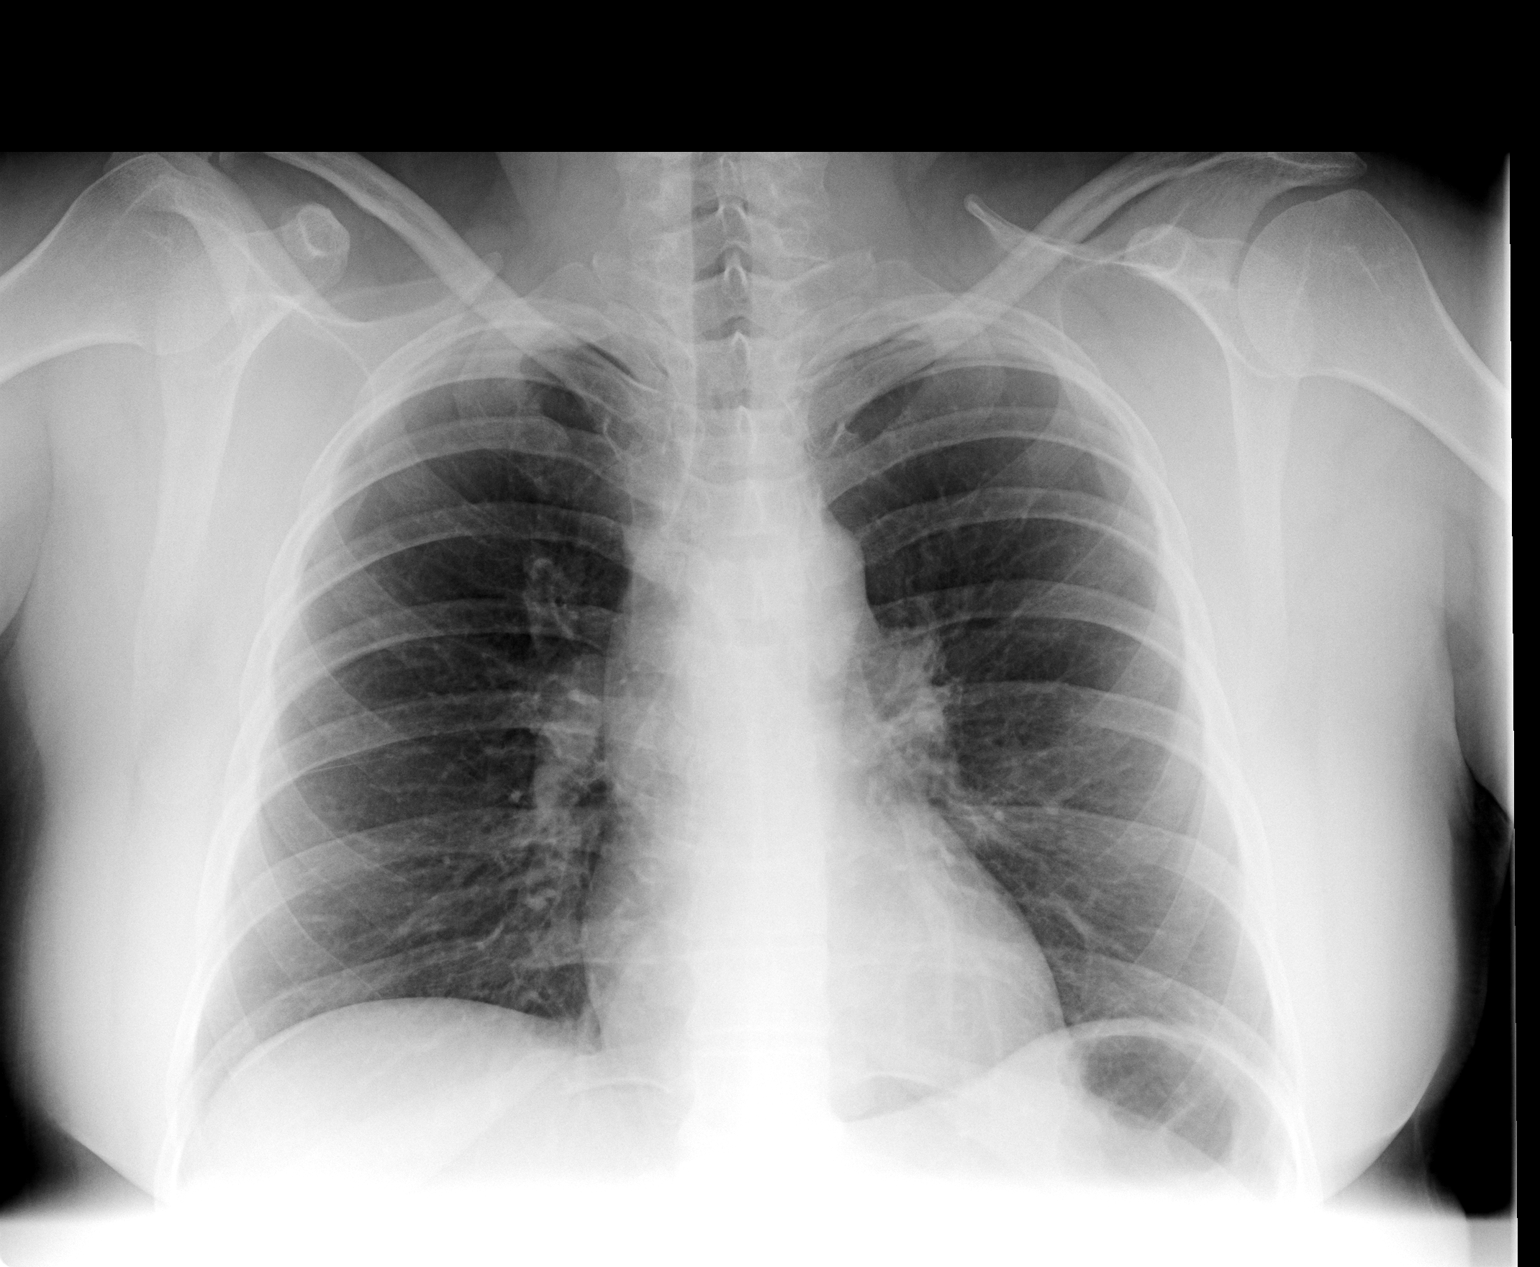

[view not recorded (2 of 2)]
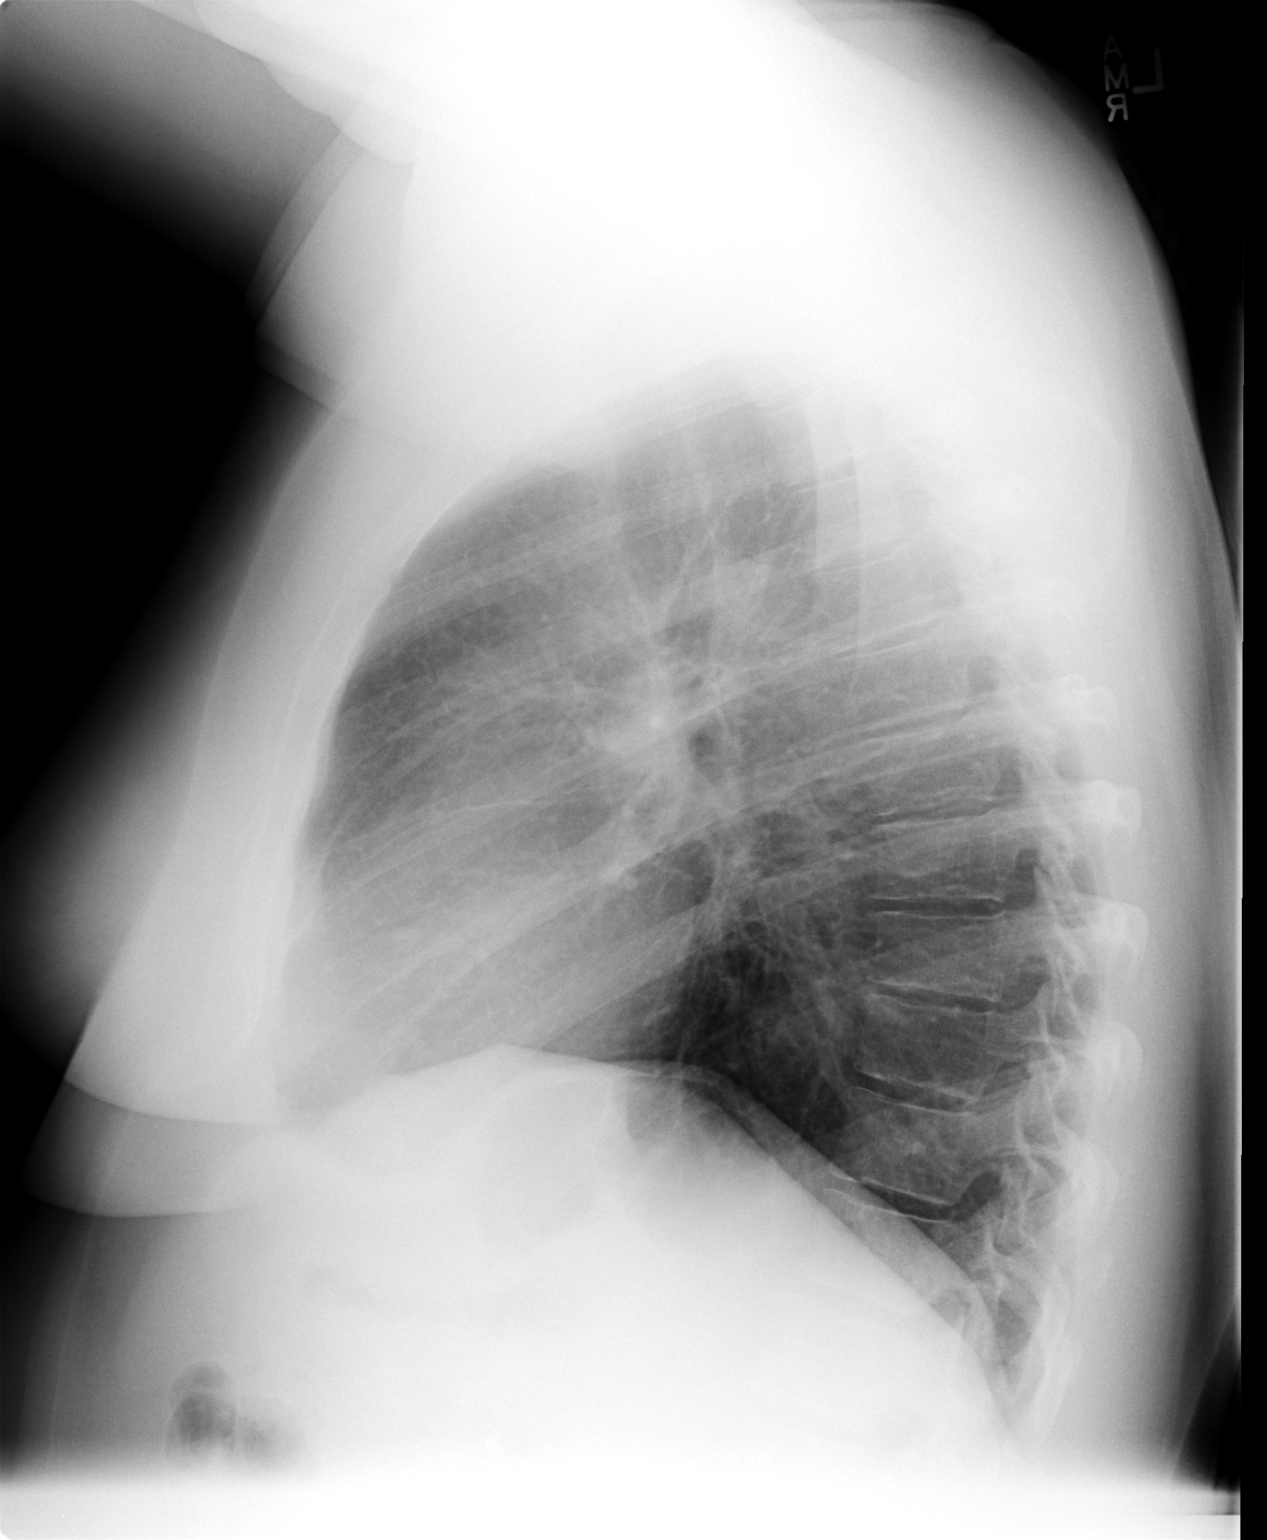

[2 of 2 positions shown; findings below may reference images not displayed]

FINDINGS: The heart size and mediastinal contours are within normal limits.
There is no focal infiltrate, pulmonary edema, or pleural effusion.
The visualized skeletal structures are unremarkable.
IMPRESSION: No active cardiopulmonary disease.
